# Patient Record
Sex: Female | Born: 1960 | Race: White | Hispanic: No | Smoking: Never smoker
Health system: Southern US, Community
[De-identification: ages and names within clinical notes are randomized; demographics above are authoritative.]

## PROBLEM LIST (undated history)

## (undated) DIAGNOSIS — E785 Hyperlipidemia, unspecified: Secondary | ICD-10-CM

## (undated) DIAGNOSIS — R079 Chest pain, unspecified: Secondary | ICD-10-CM

## (undated) DIAGNOSIS — I10 Essential (primary) hypertension: Secondary | ICD-10-CM

## (undated) HISTORY — DX: Essential (primary) hypertension: I10

## (undated) HISTORY — DX: Chest pain, unspecified: R07.9

## (undated) HISTORY — DX: Hyperlipidemia, unspecified: E78.5

---

## 1999-11-16 ENCOUNTER — Encounter: Payer: Self-pay | Admitting: *Deleted

## 1999-11-17 ENCOUNTER — Ambulatory Visit (HOSPITAL_COMMUNITY): Admission: RE | Admit: 1999-11-17 | Discharge: 1999-11-18 | Payer: Self-pay | Admitting: *Deleted

## 1999-11-17 ENCOUNTER — Encounter: Payer: Self-pay | Admitting: *Deleted

## 1999-12-27 ENCOUNTER — Ambulatory Visit (HOSPITAL_COMMUNITY): Admission: RE | Admit: 1999-12-27 | Discharge: 1999-12-27 | Payer: Self-pay | Admitting: *Deleted

## 1999-12-27 ENCOUNTER — Encounter: Payer: Self-pay | Admitting: *Deleted

## 2001-05-01 ENCOUNTER — Ambulatory Visit (HOSPITAL_COMMUNITY): Admission: RE | Admit: 2001-05-01 | Discharge: 2001-05-01 | Payer: Self-pay | Admitting: *Deleted

## 2001-05-01 ENCOUNTER — Encounter: Payer: Self-pay | Admitting: *Deleted

## 2001-05-14 ENCOUNTER — Encounter: Payer: Self-pay | Admitting: *Deleted

## 2001-05-14 ENCOUNTER — Ambulatory Visit (HOSPITAL_COMMUNITY): Admission: RE | Admit: 2001-05-14 | Discharge: 2001-05-14 | Payer: Self-pay | Admitting: *Deleted

## 2001-05-19 ENCOUNTER — Encounter: Payer: Self-pay | Admitting: *Deleted

## 2001-05-19 ENCOUNTER — Inpatient Hospital Stay (HOSPITAL_COMMUNITY): Admission: RE | Admit: 2001-05-19 | Discharge: 2001-05-20 | Payer: Self-pay | Admitting: *Deleted

## 2008-08-03 ENCOUNTER — Observation Stay (HOSPITAL_COMMUNITY): Admission: EM | Admit: 2008-08-03 | Discharge: 2008-08-04 | Payer: Self-pay | Admitting: Emergency Medicine

## 2008-09-01 DIAGNOSIS — R079 Chest pain, unspecified: Secondary | ICD-10-CM

## 2008-09-01 HISTORY — DX: Chest pain, unspecified: R07.9

## 2008-09-01 HISTORY — PX: US ECHOCARDIOGRAPHY: HXRAD669

## 2008-09-01 HISTORY — PX: CARDIOVASCULAR STRESS TEST: SHX262

## 2008-09-09 DIAGNOSIS — E785 Hyperlipidemia, unspecified: Secondary | ICD-10-CM

## 2008-09-09 DIAGNOSIS — I10 Essential (primary) hypertension: Secondary | ICD-10-CM

## 2008-09-09 HISTORY — DX: Hyperlipidemia, unspecified: E78.5

## 2008-09-09 HISTORY — DX: Essential (primary) hypertension: I10

## 2010-06-27 LAB — POCT CARDIAC MARKERS
CKMB, poc: <1 ng/mL — ABNORMAL LOW (ref 1.0–8.0)
Myoglobin, poc: 42.4 ng/mL (ref 12–200)
Troponin i, poc: <0.05 ng/mL (ref 0.00–0.09)

## 2010-06-27 LAB — CBC
HCT: 35 % — ABNORMAL LOW (ref 36.0–46.0)
HCT: 38.8 % (ref 36.0–46.0)
Hemoglobin: 11.6 g/dL — ABNORMAL LOW (ref 12.0–15.0)
Hemoglobin: 13.6 g/dL (ref 12.0–15.0)
MCHC: 34.3 g/dL (ref 30.0–36.0)
MCHC: 34.4 g/dL (ref 30.0–36.0)
MCHC: 35 g/dL (ref 30.0–36.0)
MCV: 92.1 fL (ref 78.0–100.0)
MCV: 92.7 fL (ref 78.0–100.0)
MCV: 94.1 fL (ref 78.0–100.0)
Platelets: 334 10*3/uL (ref 150–400)
Platelets: 375 10*3/uL (ref 150–400)
RBC: 3.59 MIL/uL — ABNORMAL LOW (ref 3.87–5.11)
RBC: 4.22 MIL/uL (ref 3.87–5.11)
RDW: 14 % (ref 11.5–15.5)
RDW: 14 % (ref 11.5–15.5)
WBC: 10.7 10*3/uL — ABNORMAL HIGH (ref 4.0–10.5)

## 2010-06-27 LAB — URINALYSIS, ROUTINE W REFLEX MICROSCOPIC
Bilirubin Urine: NEGATIVE
Specific Gravity, Urine: 1.01 (ref 1.005–1.030)
pH: 8 (ref 5.0–8.0)

## 2010-06-27 LAB — DIFFERENTIAL
Basophils Absolute: 0.1 10*3/uL (ref 0.0–0.1)
Eosinophils Absolute: 0.1 10*3/uL (ref 0.0–0.7)
Eosinophils Relative: 1 % (ref 0–5)
Lymphocytes Relative: 29 % (ref 12–46)

## 2010-06-27 LAB — COMPREHENSIVE METABOLIC PANEL
AST: 25 U/L (ref 0–37)
CO2: 27 mEq/L (ref 19–32)
Chloride: 108 mEq/L (ref 96–112)
Creatinine, Ser: 0.71 mg/dL (ref 0.4–1.2)
GFR calc Af Amer: >60 mL/min (ref >60)
GFR calc non Af Amer: >60 mL/min (ref >60)
Glucose, Bld: 98 mg/dL (ref 70–99)
Total Bilirubin: 0.7 mg/dL (ref 0.3–1.2)

## 2010-06-27 LAB — URINE MICROSCOPIC-ADD ON

## 2010-06-27 LAB — TROPONIN I: Troponin I: 0.02 ng/mL (ref 0.00–0.06)

## 2010-06-27 LAB — POCT I-STAT, CHEM 8
BUN: 11 mg/dL (ref 6–23)
Calcium, Ion: 1.2 mmol/L (ref 1.12–1.32)
Chloride: 104 mEq/L (ref 96–112)
Creatinine, Ser: 0.7 mg/dL (ref 0.4–1.2)
Glucose, Bld: 73 mg/dL (ref 70–99)
HCT: 42 % (ref 36.0–46.0)
Hemoglobin: 14.3 g/dL (ref 12.0–15.0)
Potassium: 3.3 mEq/L — ABNORMAL LOW (ref 3.5–5.1)
Sodium: 140 mEq/L (ref 135–145)
TCO2: 27 mmol/L (ref 0–100)

## 2010-06-27 LAB — PROTIME-INR: Prothrombin Time: 12.8 seconds (ref 11.6–15.2)

## 2010-06-27 LAB — HEPARIN LEVEL (UNFRACTIONATED): Heparin Unfractionated: 0.9 IU/mL — ABNORMAL HIGH (ref 0.30–0.70)

## 2010-06-27 LAB — GLUCOSE, CAPILLARY: Glucose-Capillary: 133 mg/dL — ABNORMAL HIGH (ref 70–99)

## 2010-06-27 LAB — LIPID PANEL: Cholesterol: 166 mg/dL (ref 0–200)

## 2010-06-27 LAB — D-DIMER, QUANTITATIVE: D-Dimer, Quant: 0.26 ug/mL-FEU (ref 0.00–0.48)

## 2010-08-01 NOTE — H&P (Signed)
NAME:  Monica Wells, Monica Wells                 ACCOUNT NO.:  [id number]   MEDICAL RECORD NO.:  [id number]          PATIENT TYPE:  INP   LOCATION:  4731                         FACILITY:  [facility]   PHYSICIAN:  Richard A. Alanda Amass, M.D.DATE OF BIRTH:  1960/12/01   DATE OF ADMISSION:  08/03/2008  DATE OF DISCHARGE:                              HISTORY & PHYSICAL   CHIEF COMPLAINT:  Chest pain.   A 50 year old white female with no prior history of coronary disease,  came to the emergency room secondary to chest pain by EMS.  This  morning, she was awake at 6 a.m., preparing to go to work, developed a  headache over her left eye associated with general malaise, nausea.  She  took Midol and then developed some slight chest pain that increased,  initially felt like an knife-like pressure, but when Dr. Alanda Amass  specifically asked her, was more severe heaviness and gnashing in  midsternal area, initially rated 8/10.  She had shortness of breath and  tingling in her left arm, nausea, and slightly diaphoretic with it.  EMS  picked her up and gave her 1 nitro, but her pressure bottomed down with  this, but it did help with the chest pain.  In the emergency room, she  was given Morphine and then developed pain more diaphragmatic across her  diaphragm area that was brief.  She does have a history of reflux  disease.  This is somewhat similar, but much worse.  Other history  includes gestational diabetes which resolved, hyperlipidemia.  Surgical  history of C-spine surgery x2 in 2001 and 2005.  She has 2 plates and 2  screws in her neck.  She has had a cholecystectomy.  She has had left  carpal tunnel release and a C-section.   FAMILY HISTORY:  Mother is in her 87s, no coronary disease.  Father is  in his 1, he had MI in his 79.  Siblings without coronary disease.   SOCIAL HISTORY:  Married, with 48 child 64 year old daughter.  Lives in  [place].  [place] for [employer] [place], her husband does as  well, and they came to [place], though her primary care is in [place]  because they were closer.  Additionally, the patient's mother-in-law is  a cardiology patient of Dr. Allyson Sabal.  The patient does walk a lot at work.  Does not use tobacco or alcohol.   ALLERGIES:  CODEINE.   OUTPATIENT MEDICATIONS:  Omeprazole 20 mg daily and naproxen p.r.n.  though she did develop constipation with this, so she recently stopped  taking it.   REVIEW OF SYSTEMS:  HEENT:  She does wear glasses.  No blurred vision or  double vision.  SKIN:  Without rashes.  NEUROLOGIC:  Some tingling in  the left arm today, but no syncope, no tingling of her face with her  headache.  GI:  History of reflux disease.  No melena.  Some  constipation.  GU:  No hematuria or dysuria.  MUSCULOSKELETAL:  Neck  surgery as stated and occasional neck pain.  ENDOCRINE:  No diabetes or  thyroid  disease.  CARDIOVASCULAR:  As above.  PULMONARY:  Negative.   PHYSICAL EXAMINATION:  VITAL SIGNS:  Blood pressure 121/86 initially,  currently 110/52, respirations 18, pulse 70, temperature 97.6.  GENERAL:  Alert, oriented white female in no acute distress.  Husband is  with her during the exam.  HEENT: Normocephalic.  Glasses in place.  Sclerae clear.  NECK:  Supple.  No JVD.  No bruits.  LUNGS:  Clear without rales, rhonchi, or wheezes.  HEART:  Heart sounds S1, S2, regular rate and rhythm without murmur,  gallop, rub, or click.  ABDOMEN:  Soft, nontender, positive bowel sounds.  LOWER EXTREMITIES:  Without edema.  Pedal pulses are present.  SKIN:  Warm and dry.  Brisk capillary refill.  NEUROLOGIC:  Alert and oriented x3.  Moves all extremities.  Follows  commands.   LABORATORY DATA:  Hemoglobin 13.6, hematocrit 38.8, WBC 10.7, platelets  375.  Sodium 140, potassium 3.3, BUN 11, creatinine 0.7, glucose 73,  myoglobin 42, CK-MB less than 1.0, troponin I less than 0.05, INR 1, PTT  29.   IMPRESSION:  1. Chest pain, rule out  myocardial infarction, with family history of      coronary disease and history of dyslipidemia.  2. History of gastroesophageal reflux disease.  3. History of neck surgery.   PLAN:  Admit to telemetry unless pain relieved, and then we will  increase her acuity to a step-down bed.  Serial CK-MBs, IV heparin.  Due  to the hypotension, unable to give nitroglycerin now, will hold, and  also avoid starting ACE inhibitors.  We will put her on low-dose beta-  blocker if her pressure will tolerate it.  Parameters were given.  Additionally, Dr. Alanda Amass feels if her enzymes are negative and EKG  remains unchanged, she would be a candidate for outpatient stress test  versus cardiac CT in the morning.  We will do a D-dimer, if this is  elevated, we will go ahead and do a CT angio tonight to rule out PE.  The patient and her husband had no further questions.       Darcella Gasman. [place], N.P.      Richard A. Alanda Amass, M.D.  Electronically Signed    LRI/MEDQ  D:  08/03/2008  T:  08/04/2008  Job:  [id number]

## 2010-08-01 NOTE — Discharge Summary (Signed)
Monica Wells, Monica Wells                 ACCOUNT NO.:  [id number]   MEDICAL RECORD NO.:  [id number]          PATIENT TYPE:  INP   LOCATION:  4731                         FACILITY:  [facility]   PHYSICIAN:  Ritta Slot, MD     DATE OF BIRTH:  06-Jun-1960   DATE OF ADMISSION:  08/03/2008  DATE OF DISCHARGE:  08/04/2008                               DISCHARGE SUMMARY   DISCHARGE DIAGNOSES:  1. Chest pain, atypical.  2. History of gastroesophageal reflux disease.  3. History of gestational diabetes.  4. Mild elevated lipids.   LABORATORY DATA:  Hemoglobin 11.6, hematocrit 33.8.  Lipids, total  cholesterol 166, HDL 41, LDL 110.  CK-MBs and troponins were negative.   HOSPITAL COURSE:  [name] came in with complaints of chest pain.  She  said they were sharp in her epigastrium, rated 8/10 and she had some  nausea, and she had tingling in her left arm.  She was brought in the  hospital for further evaluation.  CK-MBs and troponins were negative.  She was placed on heparin.  This was discontinued on Aug 04, 2008.  She  was considered stable for discharge home to have outpatient followup.  She will undergo stress test and a 2-D echocardiogram and then follow up  with Dr. Ritta Slot.   DISCHARGE MEDICATIONS:  1. Metoprolol succinate 12.5 mg once per day.  2. Aspirin 81 mg 2 per day.  3. Simvastatin 20 mg at bedtime.  4. She should increase her omeprazole to 20 mg twice per day.  Her blood pressures were running low at 97/60, thus her metoprolol was  cut down at the time of discharge.      Lezlie Octave, N.P.      Ritta Slot, MD  Electronically Signed    BB/MEDQ  D:  08/04/2008  T:  08/05/2008  Job:  [id number]   cc:   Lanora Manis

## 2010-08-04 NOTE — Discharge Summary (Signed)
[place]. [facility]  Patient:    Monica Wells, Monica Wells Visit Number: [id number] MRN: [id number]          Service Type: SUR Location: [id number] Attending Physician:  Evonnie Dawes Dictated by:   Donnal Debar Gasper Sells, M.D. Admit Date:  05/19/2001 Discharge Date: 05/20/2001   CC:         Elsie Stain, M.D.   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  DISCHARGE DIAGNOSES: 1. Status post right C4-5 cervical laminotomy and C5 foraminotomy. 2. Harvesting of autograft from C4-5 laminectomy site as well as the spinous    process of C6. 3. C4-5 facet fusion and fixation from C5-6 to C4 using Synthes ______ fix    instrumentation titanium rod with distraction at that level.  SURGEON:  Ricky D. Gasper Sells, M.D.  ASSISTANT:  Annie Sable, Montez Hageman., M.D.  ANESTHESIA:  General, controlled.  Sponge, needle, and instrument counts were correct.  HISTORY OF PRESENT ILLNESS:  This is a 50 year old female whom I previously did a C5-6 anterior disk infusion with a titanium plate approximately 19 months ago.  This was uneventful.  Within the last month she turned while at work and reached for something, she felt something pop in her neck and developed severe right shoulder pain, specifically the superior mesial spine of the scapula with numbness and tingling going down the right arm, and some weakness of supraspinatus and deltoid muscles.  ______ could be guarding.  She also had supraclavicular tenderness and a clearly positive right Adsons for decreased pulse and numbness and tingling of the hand.  I did a video flexion extension view with Dr. Benard Rink in the week prior to surgery, and I felt that the C4-5 level was unstable on the right side, and perhaps even a little bit on the left.  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  The syndrome fit  perfectly. Nevertheless, I did a selective right C4 facet joint block preoperatively, and under fluoroscopic control, and using this technique I was able to block her pain with 1 cc of 0.5% bupivacaine injected dorsolaterally in the C4-5 facet joint.  She was sat up in the operating room, and her pain was completely gone.  For this reason, further blocks were not carried out specifically at the right C6-7 level.  For this reason, she was then taken to the operating room where an uncomplicated approach was made with x-ray control at the C4, C5, and C6 level.  The C4-5 level on the right side was grossly diastatic and clearly hypermobile and unstable.  The joint opened greater than 5 mm, with the upper limit of normal being 1.3 mm.  On the left side, there was some laxness of the joint capsule and was still present.  This probably could have healed on the right side.  There is no question that this was completely gone.  Preoperatively, I could relieve some of her discomfort by distracting her head on her cervical spine using axial traction, so I distracted the level once the fixation system in place and locked into place, and displaced approximately 1.5 mm.  I also did a foraminotomy/laminotomy of C4-5 on the right side, and fused the C4-5 facet bilaterally with DBX bone paste, as well as the paste its own bone which was harvested from the laminectomy site, as well as the spinous process at C6, macerated and placed  back into the patient.  The wound was then irrigated with vancomycin solution once the dorsal nuchal fascia was closed, as well as subcutaneously.  In the accumulated [employer] experience, there is a 6% infection rate with posterior spinal fixation which is remarkably high, this is whether it is in the lumbar, thoracic, or cervical spine.  It is actually higher the higher you go up in the cervical spine for some reason.  The infusion and intraoperative vancomycin  intravenously was done to hopefully decrease this risk.  The patient awoke from surgery without her usual left shoulder pain.  I would point out that I also cut the nerve which goes from the C4-5 facet joint, which is the first dorsal ramus of the fifth cervical root, or the so called fifth cervical nerve.  This also makes an afferent contribution to the S-cervicalis reflex leading to the abnormal Adsons maneuver.  The idea of cutting the first dorsal ramus came to me with the observation that these patients, even though they have clearly positive C4-5 facet instability syndrome, often have significant residual pain in their shoulder.  In her case, she awoke from surgery without her usual shoulder pain.  The weakness she manifested in her supraspinatus and deltoids was completely gone.  I delayed discharge of her for a couple of hours because she was given a shot of Demerol, and it made her too drowsy to discharge her.  She was discharged in the afternoon following surgery.  Her condition at discharge was markedly improved.  DISPOSITION:  Home.  Her vital signs are stable.  She is voiding and eating normally.  FOLLOWUP:  She was told to come and see me in my office in one week.  WOUND CARE:  She was given dressings, and told to change her dressing every second day.  CONDITION ON DISCHARGE:  After her shower, her condition at discharge improved.  DIAGNOSIS:  Status post C4-5 fusion and fixation C4 to C6 using Surgifix Synthes instrumentation. Dictated by:   Donnal Debar Gasper Sells, M.D. Attending Physician:  Evonnie Dawes DD:  05/20/01 TD:  05/21/01 Job: [id number]

## 2010-08-04 NOTE — Discharge Summary (Signed)
[place]. [facility]  Patient:    Monica Wells, Monica Wells                      MRN: [id number] Adm. Date:  [id number] Disc. Date: [id number] Attending:  Evonnie Dawes CC:         Berenice Bouton, M.D., [facility]  Gershon Crane, M.D., [facility]   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  Specifically, primarily vertigo with sustained nystagmus when looking to the left, when extending or rotating the head to that side, or any sort of painful stimulus like for example, palpation of the joint with a lesser degree of clinical significance to the weakness of the biceps on the left and numbness and tingling on the thumb and index finger on the left.  No signs of myelopathy.  SUMMARY:  I saw this interesting 50 year old female with an atypical presentation of cervical disk disease on November 15, 1999.  She actually is a sister of a patient of mine.  She tells me that she woke up around 2 oclock in the morning approximately three months ago with neck pain, left arm pain, and a secondary syncopal episode.  She had been investigated exhaustively for this.  She had an MRI dated November 03, 1999, which showed a disk on the left at C5-6.  When I examined her, she was minimally weak in the biceps.  She did have some numbness and tingling in the thumb and index finger on the left. There were no signs of myelopathy.  Her left biceps jerk was decreased.  Significantly, she had signs of a hypovestibular system on the left side, possibly due to descending short loop inhibitory phenomena.  Alternatively she probably had vestibular input problems on that side.  Nevertheless, she had positive rombergism to the left.  She had some difficulty identifying objects blinded in the left side of her space, and she had sustained nystagmus on painful stimulation to the neck when staring to the left.  I  think all of these are epiphenomena.  I took her to the operating room and removed the C5-6 disk.  There was a definite rent in the annulus at C5-6 eccentric to the left, and there was disk material on the other side of the annulus at that level.  The annulus and posterior longitudinal ligament were removed.  The dura was exposed.  A foraminotomy at C6 was performed on that side.  There were no complications. The dura was intact.  A 6 mm posterior 8 mm anterior truncated wedge was inserted in the disk space, and the whole thing was held in place with a 26 mm titanium plate and four 14 mm self-tapping screws, EBI system.  The patient awoke from surgery without her usual pain.  She ambulated nicely on the evening following surgery and on the morning following surgery.  Her vital signs remained stable.  The symptoms that she came in with; namely, neck pain, left arm pain, and dizziness completely resolved.  Her strength returned to normal in her biceps.  She was discharged home with OxyContin 10 mg p.o. b.i.d. for pain.  I will see her in my office in four days time in followup.  CONDITION ON DISCHARGE:  Markedly better.  DISPOSITION:  Home.  DISCHARGE DIAGNOSIS:  Status post anterior C5-6 fusion with titanium fixation. D:  11/18/99 TD:  11/20/99 Job: [id number]

## 2010-08-04 NOTE — H&P (Signed)
[place]. [facility]  Patient:    Monica Wells, Monica Wells Visit Number: [id number] MRN: [id number]          Service Type: SUR Location: 5700 [id number] [id number] Attending Physician:  Evonnie Dawes Dictated by:   Dora Sims, M.D.D. Admit Date:  05/19/2001   CC:         Annie Paras, M.D.   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  OPERATION: 1. Right C4-5 cervical laminotomy. 2. Harvesting of autograft bone from the C4-5 laminotomy site as well as    the spinous process of C6. 3. C4-5 facet fusion and fixation from C5-6 to C4 using Synthes Cervifix    instrumentation and a titanium rod with distraction at that level.  SURGEON:  Ricky D. Gasper Sells, M.D.  ASSISTANT:  Annie Sable, Montez Hageman., M.D.  ANESTHESIA:  General, controlled.  COMPLICATIONS:  Nil.  ESTIMATED BLOOD LOSS:  Less than 100 cc.  SUMMARY:  This is 50 year old female on whom I performed a C5-6 fusion in uncomplicated fashion 19 months prior to this procedure.  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  I could not make my mind up whether her weakness of her deltoid and supraspinatus muscle was guarding or was in fact due to a ruptured disk above the level of the fusion. Myelogram CT scan ruled out a ruptured disk at C4-5 and C6-7.  The fusion looked intact.  The MRI similarly showed the same things; however, there were some abnormalities at C6-7, particularly on the myelogram.  It was decided to observe her, and this was carried out for about a week, and she did not get any better.  She presented with, not only the weakness in the supraspinatus and deltoids on the right, but also numbness and tingling involving the right hand, particularly when turning her head to  the left.  She had supraclavicular tenderness and a positive Adsons maneuver on the right side.  She also had tenderness over the upper cervical zygapophyseal joints on digital palpation and an area of point tenderness in the superior mesial spine of the scapula which is well described by Professor Aggie Hacker in [place] as a trigger point for the C4-5 facet joint.  Earlier on the day of this surgery, it was clearly demonstrated by an upper cervical block of the right C4-5 facet joint that this was the area of her major discomfort, and the discomfort could be relieved by blocking the joint with local anesthetic.  Based on this and the clear evidence of instability of this joint on video flexion-extension view the week prior to surgery, it was decided to fuse that level to the stable C5-6 level.  This was carried out today with distraction without difficulty and including in the operation a right C4-5 cervical laminotomy, foraminotomy of the C5 nerve root.  No complications occurred.  The patient awoke from surgery without her usual findings.  The intraoperative findings, however, were that the C4-5 joint was clearly diastatic at least 5 mm, upper limits of norma being 1.3 mm.  There was no joint capsule on the right side.  On the left side, the level was a little bit lax, but certainly there was intact joint capsule and no evidence of instability on that side.  Therefore,  she had unilateral instability of the C4-5 level at the facet joint level.   Usually this was associated with some abnormalities in the disk as well; however, these were not demonstrated by myelogram, CT scan, or MRI.  Intraoperative control x-ray confirmed the C4-5 level had been fused to the C5-6 level.  DESCRIPTION OF PROCEDURE:  With the patient in the prone position and the patients head held in the Mayfield head clamp and carefully positioned in the neutral position, the patient was prepped and draped in the usual  fashion for a midline posterior approach.  Bearhugger blanket was in place as were PAS hose, and all pressure points had been carefully inspected and padded.  A midline incision was made, staying in the relatively avascular midline and controlling hemostasis with unipolar coagulation, the spinous process of C5 was identified and confirmed by intraoperative control x-ray.  Paraspinal muscles and ligaments were then dissected free of the C4, C5, and C6 levels bilaterally.  A McCullough retractor was then inserted.  Bringing in the microscope, dissection was carried out more laterally at the clearly diastatic and unstable right C4-5 facet level down to the level of the first dorsal ramus of the C5 nerve root or so called fifth cervical nerve.  This nerve supplies the facet joint.  It was identified and divided with unipolar coagulation.  It also supplies a small area of skin sensation in the midline above the C5 spinous process.  In addition, there is some connection to the facet in a reflex fashion as the cervicalis to the scalenus musculature, accounting for the positive Adsons maneuver.  This is incidentally noted.  Using a high-speed drill, an laminotomy/foraminotomy of C4-5 was then carried out, the foramina being the C5 nerve root.  The dorsal 3 mm and lateral 3 mm of the facet joint were then ground down to cancellous bone, and this cancellous bone was roughened up with a bone curet superiorly and inferiorly.  On the left, the dorsal 3 mm and lateral 3 mm of the  C4-5 joint was then drilled out using the high-speed drill, and the bone was roughened up down to bleeding cancellous bone.  It was clear that the C4-5 level, right much more so than left, was unstable. The mid point of the lateral mass of C4, C5, and C6 was then identified and marked.  Then, using the axis 3.5 mm drill set, a hole was drilled from the mid point superiorly and laterally so as to miss the vertebral artery as  well  as the nerve roots, basically following the plane of the facet joints.  These holes were then tapped with a 3.5 mm tap.  Then, using the Synthes Cervifix system with lateral attachment hardware and an appropriate length titanium rod, fixation was carried out first on the right and then on the left, snugging the fixation cleanly using 3.5 x 12 mm screws mass at C5 and C6 and leaving the C4 lateral mass, using a 3.5 x 14 mm screw loose.  Distractor was then placed, first on the right to distract the C4-5 level and then firm up the C4 lateral mass screw and then on the left.  The C4-5 facet was then packed with DBX bone paste mixed with the patients own bone which had been harvested.  This was held in place with a piece of Surgicel.  The wound was then irrigated with Betadine solution and hydrogen peroxide.  The self-retaining retractor was then removed, and the dorsonuchal fascia was  closed with 2-0 Vicryl in interrupted fashion.  When the fascia was closed, 5 cc of vancomycin solution was placed subfascially, and then the skin was closed with 2-0 Vicryl inverted interrupted.  Skin staples were used in the skin until one staple was left when a further 5 cc of vancomycin solution was injected subcutaneously, and then the last staple was placed.  Betadine ointment and dry dressing were applied.  The patient was then turned into her own bed while still intubated, and the Mayfield head clamp was removed.  The inferior clamp hole on the left side was bleeding, so it was stapled shut with 2 staples.  A Newton brace was then applied to the patients neck.  The patient tolerated the procedure well and awoke from surgery unremarkably, moving all four extremities without her usual right shoulder pain and weakness. Dictated by:   Dora Sims, M.D.D. Attending Physician:  Evonnie Dawes DD:  05/19/01 TD:  05/19/01 Job: [id number] January. [phone]

## 2010-08-04 NOTE — Discharge Summary (Signed)
. [facility]  Patient:    Monica Wells, Monica Wells Visit Number: [id number] MRN: [id number]          Service Type: SUR Location: [id number] Attending Physician:  Evonnie Dawes Dictated by:   Donnal Debar Gasper Sells, M.D. Admit Date:  05/19/2001   CC:         Annie Paras, M.D.   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  OPERATION:  Upper cervical block, specifically right C4-5 facet joint block.  SURGEON:  Ricky D. Gasper Sells, M.D.  ANESTHESIA:  Local.  PREMEDICATION:  0.2 mg Robinul.  COMPLICATIONS:  Nil.  SUMMARY:  This is a 50 year old female on whom I previously did a C5-6 anterior disk infusion.  This was uneventful.  Approximately 19 months following this surgery, she turned while at work and felt something pop in her neck and developed severe right shoulder pain, specifically the superior mesial spine of the scapula with numbness and tingling going down the right arm and some weakness of the supraspinatus and deltoid muscles which could be guarding.  She also has supraclavicular tenderness and a clearly positive right axis maneuver for both decreased pulse and numbness and tingling in the hand.  There was concern that her C4-5 level on the right might be unstable. A video flexion-extension view was done on this lady the week prior to surgery.  While it was said that she had compensatory hypermobility at the C4-5 level, what she actually had was diastasis of the right C4-5 facet clearly visible on the video, of greater than 3, perhaps 4 mm, which closed down with neck spasm.  This is a strongly positive test since, if you can see it on the video, and this sort of thing is usually missed on conventional lateral flexion-extension views, it must be quite unstable.  Nevertheless, just to prove that this was the source of her pain and  discomfort and, given the fact that the natural history of facet diastasis is that of progression in the cervical spine, it was decided to block this level.  A 25-gauge needle was inserted down to the dorsolateral aspect of the C4-5 right-sided facet in the AP plane.  Then 0.5 cc of 0.5 bupivacaine was injected.  Prior to injection, poking the joint recreated her usual discomfort.  The needle was then relocalized more laterally and interiorly, and a further 0.5 cc of 0.5 bupivacaine was injected.  This completely relieved her discomfort.  She as able to move her neck normally following this injection of only 1 cc of local anesthetic, and, by the way, there had been aspiration before each injection.  Based on this, she is a candidate for a C4-5 fusion.  DESCRIPTION OF PROCEDURE:  With the patient in the prone position, with the patients neck prepped with Betadine solution, and using AP fluoroscopic control, a 25-gauge needle was inserted down to the dorsilateral aspect of the C4-5 facet joint.  Poking the joint in that location recreated her usual discomfort.  Using 0.5 bupivacaine without epinephrine or preservative, 0.5 cc of local anesthetic was injected with aspiration beforehand.  Then the needle was localized more laterally and anteriorly, and a further 0.5 cc was injected with aspiration before the inject.  This completely knocked out her neck discomfort.  She was then sat up in the operating room, and she extended and rotated her head to the right side, and it did  not cause discomfort.  She had no further neck pain, and her Adsons sign returned to completely normal. There was no weakness in the deltoid muscle or the supraspinatus, suggesting that weakness was in fact guarding.  Based on this study, it was decided she was a good candidate for a C4-5 fusion, fusing that level, which was clearly unstable, to the more stable C5-6 level which had already been fused. Dictated by:    Donnal Debar Gasper Sells, M.D. Attending Physician:  Evonnie Dawes DD:  05/19/01 TD:  05/19/01 Job: [id number]

## 2010-11-05 IMAGING — CR DG CHEST 1V PORT
1 series · 1 of 1 positions shown · non-contrast
Comparison: None

CLINICAL DATA: Chest pain

PORTABLE CHEST - 1 VIEW

[view not recorded]
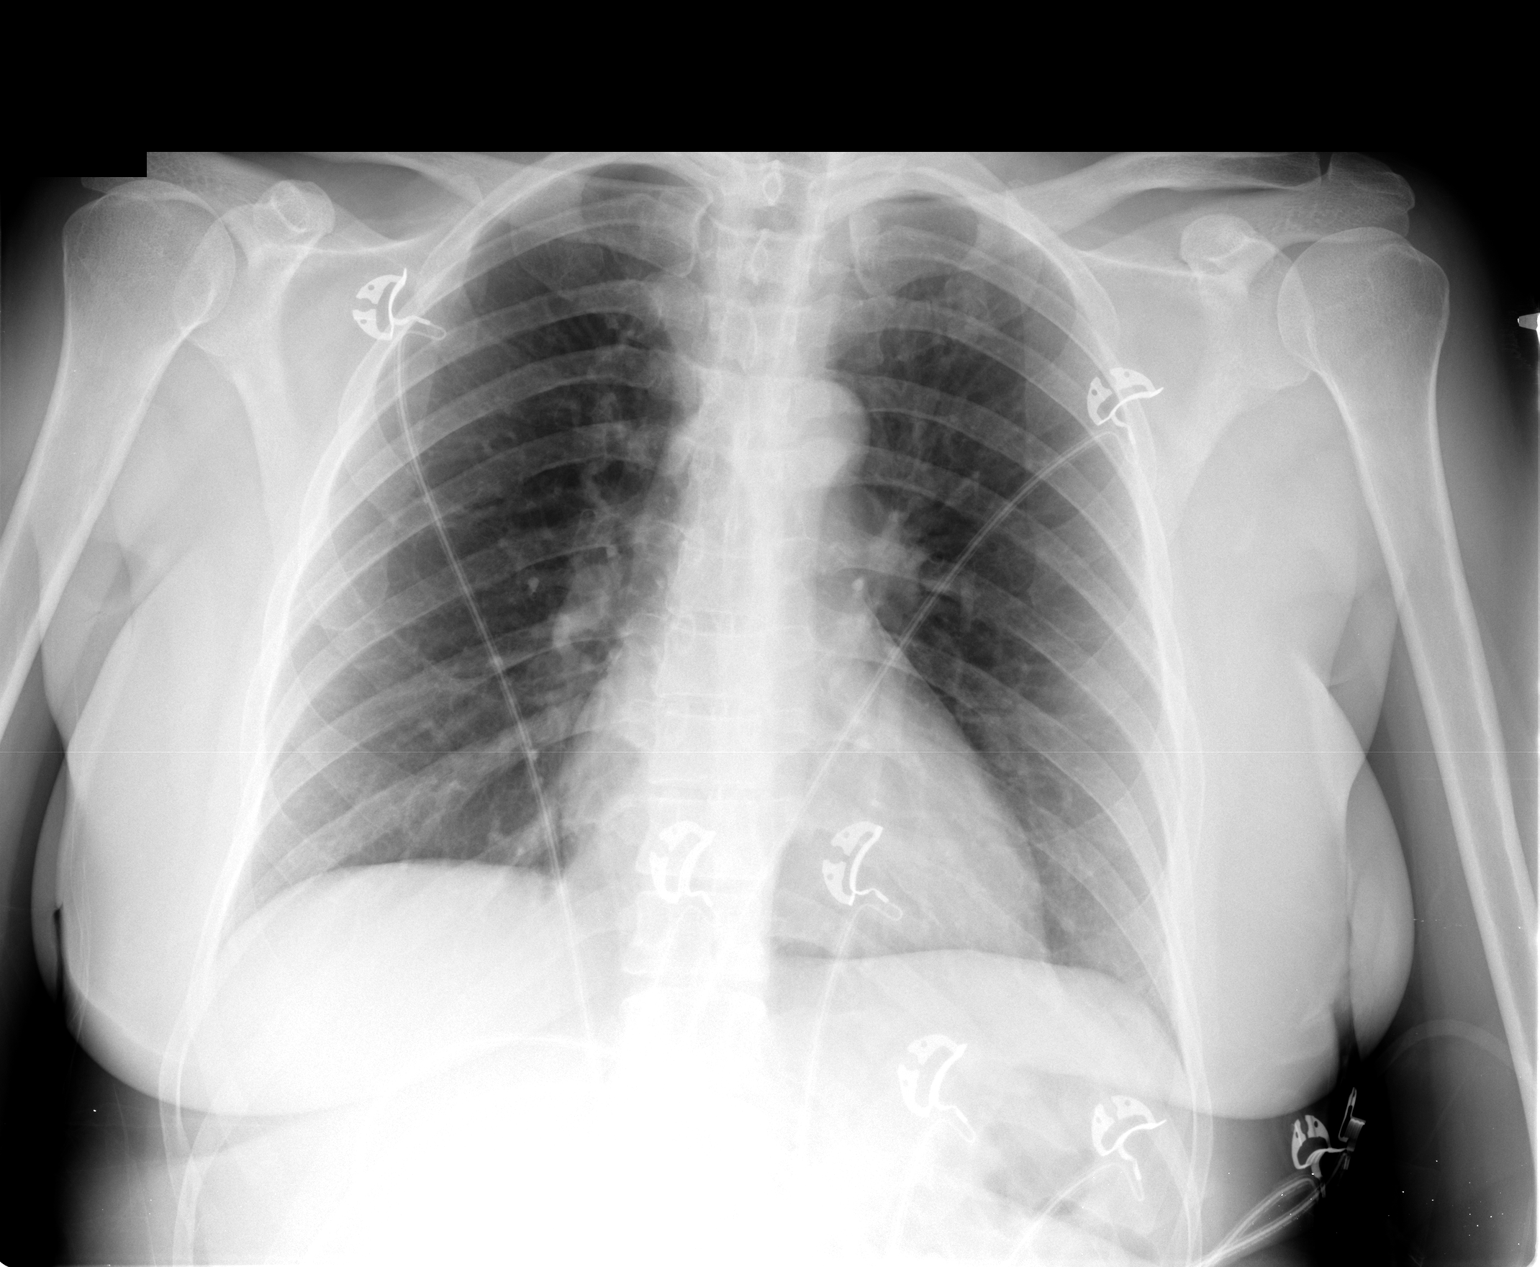

[1 of 1 positions shown; findings below may reference images not displayed]

FINDINGS: The lungs are clear.  The heart size is normal.  No
pleural fluid.  No infiltrates.
IMPRESSION: No active disease.

## 2012-08-12 ENCOUNTER — Encounter: Payer: Self-pay | Admitting: Internal Medicine

## 2012-08-12 ENCOUNTER — Encounter: Payer: Self-pay | Admitting: *Deleted

## 2012-08-13 ENCOUNTER — Encounter: Payer: Self-pay | Admitting: Internal Medicine

## 2012-08-13 ENCOUNTER — Ambulatory Visit (INDEPENDENT_AMBULATORY_CARE_PROVIDER_SITE_OTHER): Payer: BC Managed Care – PPO | Admitting: Internal Medicine

## 2012-08-13 VITALS — BP 130/86 | HR 61 | Ht 63.0 in | Wt 140.4 lb

## 2012-08-13 DIAGNOSIS — I10 Essential (primary) hypertension: Secondary | ICD-10-CM

## 2012-08-13 DIAGNOSIS — E782 Mixed hyperlipidemia: Secondary | ICD-10-CM

## 2012-08-13 DIAGNOSIS — Z79899 Other long term (current) drug therapy: Secondary | ICD-10-CM

## 2012-08-13 DIAGNOSIS — E785 Hyperlipidemia, unspecified: Secondary | ICD-10-CM | POA: Insufficient documentation

## 2012-08-13 MED ORDER — METOPROLOL SUCCINATE ER 25 MG PO TB24: 12.5000 mg | ORAL_TABLET | Freq: Every day | ORAL | Status: DC

## 2012-08-13 MED ORDER — ATORVASTATIN CALCIUM 80 MG PO TABS: 80.0000 mg | ORAL_TABLET | Freq: Every day | ORAL | Status: DC

## 2012-08-13 NOTE — Progress Notes (Signed)
OFFICE NOTE  Chief Complaint:  Routine followup  Primary Care Physician: Berenice Bouton, MD  HPI:  Monica Wells is a 51 year old female who works for [employer] in [place]. She has a history of dyslipidemia and hypertension. In the past she had chest pain which was felt to be atypical, was evaluated for that and treated medically. She had a nuclear stress test in 2010 which was negative. Since that time she has done well with medical therapy and is currently asymptomatic. She is here essentially for refills on her medicines today. She denies any new complaints. At her last office visit we obtained a cholesterol particle study. This demonstrated a total MMR LDL particle #1602, calculated LDL-C. of 129, HDL C. of 56, and triglycerides 135.  I recommended changing simvastatin to atorvastatin 80 mg daily.  She is tolerating atorvastatin without issues, and has managed to lose 4-6 pounds.  Overall I expect her lipid profile looked much better.  PMHx:  Past Medical History  Diagnosis Date  . Chest pain 09/01/2008    Atypical chect pains---NUC performed, which showed a post stress EF of 93%, Normal LV size, EKG negative for ischemia. An ECHO was also performed on this date, which showed an EF=>55% and only mild tricuspid and mitral regurgitation---normal for age  . Dyslipidemia 09/09/2008    Patient had been taking Simvastatin, but was recently switched to Atorvastatin 80 after an NMR LipoProfile with lipids of 1602, LDL-C of 129, and LDL size of 20.4 (taken on 01/20/2012).  . Hypertension 09/09/2008    Past Surgical History  Procedure Laterality Date  . US echocardiography  09/01/2008    EF=>55% and only mild tricuspid and mitral regurgitation---normal for age  . Cardiovascular stress test  09/01/2008    Post stress EF of 93%, Normal LV size, EKG negative for ischemia.    FAMHx:  Family History  Problem Relation Age of Onset  . Cancer Mother   . Diabetes Mother   . Diabetes Father   .  Heart disease Father   . Heart attack Father   . Cancer Maternal Grandmother   . Heart failure Maternal Grandfather   . Diabetes Paternal Grandfather   . Heart disease Paternal Grandfather     SOCHx:   reports that she has never smoked. She does not have any smokeless tobacco history on file. She reports that  drinks alcohol. Her drug history is not on file.  ALLERGIES:  Allergies  Allergen Reactions  . Codeine     ROS: A comprehensive review of systems was negative except for: Cardiovascular: positive for fatigue  HOME MEDS: Current Outpatient Prescriptions  Medication Sig Dispense Refill  . aspirin 81 MG tablet Take 81 mg by mouth daily.      Marland Kitchen atorvastatin (LIPITOR) 80 MG tablet Take 1 tablet (80 mg total) by mouth daily.  90 tablet  3  . fluticasone (FLONASE) 50 MCG/ACT nasal spray Place 2 sprays into the nose daily.      . metoprolol succinate (TOPROL-XL) 25 MG 24 hr tablet Take 0.5 tablets (12.5 mg total) by mouth daily. 1/2 tablet daily  45 tablet  3  . omeprazole (PRILOSEC) 20 MG capsule Take 20 mg by mouth daily.       No current facility-administered medications for this visit.    LABS/IMAGING: No results found for this or any previous visit (from the past 48 hour(s)). No results found.  VITALS: BP 130/86  Pulse 61  Ht 5\' 3"  (1.6 m)  Wt 140 lb  6.4 oz (63.685 kg)  BMI 24.88 kg/m2  EXAM: General appearance: alert and no distress Neck: no adenopathy, no carotid bruit, no JVD, supple, symmetrical, trachea midline and thyroid not enlarged, symmetric, no tenderness/mass/nodules Lungs: clear to auscultation bilaterally Heart: regular rate and rhythm, S1, S2 normal, no murmur, click, rub or gallop Abdomen: soft, non-tender; bowel sounds normal; no masses,  no organomegaly Extremities: extremities normal, atraumatic, no cyanosis or edema Pulses: 2+ and symmetric Skin: Skin color, texture, turgor normal. No rashes or lesions Neurologic: Grossly  normal  EKG: Normal sinus rhythm at 61  ASSESSMENT: 1. Hypertension-controlled 2. Dyslipidemia  PLAN: 1.   [name] continues to do well. She is active in her job and does have some problems with soreness in her legs and occasional swelling. She has some reticular veins but no obvious varicose veins. Her blood pressure is well-controlled and she has managed to lose weight which recommended her on. We will plan to recheck her lipid NMR, and adjust her cholesterol medication accordingly. Plan to continue see her back in the office annually.  Chrystie Nose, MD, [facility] Attending Cardiologist [facility]  HILTY,Kenneth C 08/13/2012, 5:45 PM

## 2012-08-13 NOTE — Patient Instructions (Signed)
Your physician recommends that you continue on your current medications as directed. Please refer to the Current Medication list given to you today.  Your physician wants you to follow-up in: 12 months You will receive a reminder letter in the mail two months in advance. If you don't receive a letter, please call our office to schedule the follow-up appointment.  

## 2012-08-14 LAB — COMPREHENSIVE METABOLIC PANEL
ALT: 20 U/L (ref 0–35)
BUN: 9 mg/dL (ref 6–23)
CO2: 29 mEq/L (ref 19–32)
Calcium: 9.8 mg/dL (ref 8.4–10.5)
Chloride: 102 mEq/L (ref 96–112)
Creat: 0.78 mg/dL (ref 0.50–1.10)
Glucose, Bld: 95 mg/dL (ref 70–99)

## 2012-08-15 LAB — NMR LIPOPROFILE WITH LIPIDS
HDL Size: 8.8 nm — ABNORMAL LOW (ref >=9.2)
HDL-C: 41 mg/dL (ref >=40)
LDL (calc): 68 mg/dL (ref <100)
LDL Particle Number: 914 nmol/L (ref <1000)
LP-IR Score: 52 — ABNORMAL HIGH (ref <=45)
Large VLDL-P: 1.9 nmol/L (ref <=2.7)
Triglycerides: 141 mg/dL (ref <150)
VLDL Size: 45.4 nm (ref <=46.6)

## 2012-10-06 ENCOUNTER — Other Ambulatory Visit: Payer: Self-pay | Admitting: Internal Medicine

## 2012-10-07 NOTE — Telephone Encounter (Signed)
flonase refilled 10/06/12

## 2013-08-23 ENCOUNTER — Other Ambulatory Visit: Payer: Self-pay | Admitting: Internal Medicine

## 2013-08-24 NOTE — Telephone Encounter (Signed)
Rx was sent to pharmacy electronically. 

## 2013-08-26 ENCOUNTER — Other Ambulatory Visit: Payer: Self-pay | Admitting: Internal Medicine

## 2013-08-26 NOTE — Telephone Encounter (Signed)
Refilled #45 with 1 refill on 08/24/13

## 2013-09-14 ENCOUNTER — Ambulatory Visit (INDEPENDENT_AMBULATORY_CARE_PROVIDER_SITE_OTHER): Payer: BC Managed Care – PPO | Admitting: Internal Medicine

## 2013-09-14 ENCOUNTER — Encounter: Payer: Self-pay | Admitting: Internal Medicine

## 2013-09-14 VITALS — BP 112/70 | HR 71 | Ht 63.0 in | Wt 145.4 lb

## 2013-09-14 DIAGNOSIS — I10 Essential (primary) hypertension: Secondary | ICD-10-CM

## 2013-09-14 DIAGNOSIS — E785 Hyperlipidemia, unspecified: Secondary | ICD-10-CM

## 2013-09-14 NOTE — Patient Instructions (Signed)
Your physician recommends that you return for lab work in: FASTING at [facility].  We will call you with the results.  Your physician recommends that you schedule a follow-up appointment in: One year with Dr. Rennis GoldenHilty.

## 2013-09-14 NOTE — Progress Notes (Signed)
OFFICE NOTE  Chief Complaint:  Routine followup  Primary Care Physician: Monica Likes, MD  HPI:  Monica Wells is a 53 year old female who works for [employer] in [place]. She has a history of dyslipidemia and hypertension. In the past she had chest pain which was felt to be atypical, was evaluated for that and treated medically. She had a nuclear stress test in 2010 which was negative. Since that time she has done well with medical therapy and is currently asymptomatic. She is here essentially for refills on her medicines today. She denies any new complaints. At her last office visit we obtained a cholesterol particle study. This demonstrated a total MMR LDL particle #1602, calculated LDL-C. of 129, HDL C. of 56, and triglycerides 135.  I recommended changing simvastatin to atorvastatin 80 mg daily.  She initially took that without issues, but then reported some muscle pains and aches. Apparently, last summer she had an episode where she passed out several times in a row. She was taken to the emergency room and she was felt to be dehydrated. Her cholesterol medicine was stopped at that time she has not restarted it.  She has done well since then.  PMHx:  Past Medical History  Diagnosis Date  . Chest pain 09/01/2008    Atypical chect pains---NUC performed, which showed a post stress EF of 93%, Normal LV size, EKG negative for ischemia. An ECHO was also performed on this date, which showed an EF=>55% and only mild tricuspid and mitral regurgitation---normal for age  . Dyslipidemia 09/09/2008    Patient had been taking Simvastatin, but was recently switched to Atorvastatin 80 after an NMR LipoProfile with lipids of 1602, LDL-C of 129, and LDL size of 20.4 (taken on 01/20/2012).  . Hypertension 09/09/2008    Past Surgical History  Procedure Laterality Date  . US echocardiography  09/01/2008    EF=>55% and only mild tricuspid and mitral regurgitation---normal for age  .  Cardiovascular stress test  09/01/2008    Post stress EF of 93%, Normal LV size, EKG negative for ischemia.    FAMHx:  Family History  Problem Relation Age of Onset  . Cancer Mother   . Diabetes Mother   . Diabetes Father   . Heart disease Father   . Heart attack Father   . Cancer Maternal Grandmother   . Heart failure Maternal Grandfather   . Diabetes Paternal Grandfather   . Heart disease Paternal Grandfather     SOCHx:   reports that she has never smoked. She does not have any smokeless tobacco history on file. She reports that she drinks alcohol. Her drug history is not on file.  ALLERGIES:  Allergies  Allergen Reactions  . Codeine     ROS: A comprehensive review of systems was negative.  HOME MEDS: Current Outpatient Prescriptions  Medication Sig Dispense Refill  . aspirin 81 MG tablet Take 81 mg by mouth daily.      . fluticasone (FLONASE) 50 MCG/ACT nasal spray 1-2 SQUIRTS EACH NOSTRIL DAILY  16 g  3  . metoprolol succinate (TOPROL-XL) 25 MG 24 hr tablet TAKE 0.5 TABLETS (12.5 MG TOTAL) BY MOUTH DAILY. 1/2 TABLET DAILY  45 tablet  1  . omeprazole (PRILOSEC) 20 MG capsule Take 20 mg by mouth daily.       No current facility-administered medications for this visit.    LABS/IMAGING: No results found for this or any previous visit (from the past 48 hour(s)). No results found.  VITALS: BP  112/70  Pulse 71  Ht 5' 3"  (1.6 m)  Wt 145 lb 6.4 oz (65.953 kg)  BMI 25.76 kg/m2  EXAM: General appearance: alert and no distress Neck: no adenopathy, no carotid bruit, no JVD, supple, symmetrical, trachea midline and thyroid not enlarged, symmetric, no tenderness/mass/nodules Lungs: clear to auscultation bilaterally Heart: regular rate and rhythm, S1, S2 normal, no murmur, click, rub or gallop Abdomen: soft, non-tender; bowel sounds normal; no masses,  no organomegaly Extremities: extremities normal, atraumatic, no cyanosis or edema Pulses: 2+ and symmetric Skin: Skin  color, texture, turgor normal. No rashes or lesions Neurologic: Grossly normal  EKG: Normal sinus rhythm at 71  ASSESSMENT: 1. Hypertension-controlled 2. Dyslipidemia- not currently on statin  PLAN: 1.   [name] continues to do well. She is active in her job and does have some problems with soreness in her legs and occasional swelling. She had discontinued her statin due to muscle aches and pains. I would like to recheck her lipid profile to see if she needs to be on cholesterol medicine or not. Her blood pressure is well controlled. She continues to be active and asymptomatic. Plan to see her back annually or sooner as necessary.  Pixie Casino, MD, [facility] Attending Cardiologist [facility] 09/14/2013, 12:44 PM

## 2013-09-23 LAB — LIPID PANEL
Cholesterol: 218 mg/dL — ABNORMAL HIGH (ref 0–200)
HDL: 42 mg/dL (ref >39)
LDL Cholesterol: 147 mg/dL — ABNORMAL HIGH (ref 0–99)
TRIGLYCERIDES: 145 mg/dL (ref <150)
Total CHOL/HDL Ratio: 5.2 Ratio
VLDL: 29 mg/dL (ref 0–40)

## 2013-09-24 NOTE — Progress Notes (Signed)
LMTCB regarding lab results.  

## 2013-09-25 ENCOUNTER — Other Ambulatory Visit: Payer: Self-pay | Admitting: *Deleted

## 2013-09-25 ENCOUNTER — Telehealth: Payer: Self-pay | Admitting: *Deleted

## 2013-09-25 DIAGNOSIS — E785 Hyperlipidemia, unspecified: Secondary | ICD-10-CM

## 2013-09-25 MED ORDER — METOPROLOL SUCCINATE ER 25 MG PO TB24: ORAL_TABLET | ORAL | Status: DC

## 2013-09-25 MED ORDER — SIMVASTATIN 20 MG PO TABS: 20.0000 mg | ORAL_TABLET | Freq: Every day | ORAL | Status: DC

## 2013-09-25 NOTE — Telephone Encounter (Signed)
Patient notified that Dr. Rennis GoldenHilty recommends starting simvastatin 20mg  once daily. Will recheck labs in 3 months (fasting). Patient agreeable.   Med and lab ordered.

## 2013-09-25 NOTE — Telephone Encounter (Signed)
Message copied by Lindell SparELKINS, Rogelio Waynick M on Fri Sep 25, 2013 10:25 AM ------      Message from: Chrystie NoseHILTY, KENNETH C      Created: Thu Sep 24, 2013  9:50 AM       Cholesterol is too high - she should be on medication. Will need to discuss with her what she has taken and caused her side-effects and consider something else.            Dr. Rexene EdisonH ------

## 2013-09-25 NOTE — Progress Notes (Signed)
LMTCB on work # [id number]

## 2013-09-25 NOTE — Telephone Encounter (Signed)
Rx was sent to pharmacy electronically. 

## 2014-01-07 ENCOUNTER — Other Ambulatory Visit: Payer: Self-pay | Admitting: Internal Medicine

## 2014-01-09 NOTE — Telephone Encounter (Signed)
Rx was sent to pharmacy electronically. 

## 2014-10-18 ENCOUNTER — Other Ambulatory Visit: Payer: Self-pay | Admitting: Internal Medicine

## 2014-10-18 NOTE — Telephone Encounter (Signed)
REFILL 

## 2015-01-03 ENCOUNTER — Other Ambulatory Visit: Payer: Self-pay | Admitting: Internal Medicine

## 2015-01-05 ENCOUNTER — Ambulatory Visit (INDEPENDENT_AMBULATORY_CARE_PROVIDER_SITE_OTHER): Payer: BLUE CROSS/BLUE SHIELD | Admitting: Internal Medicine

## 2015-01-05 ENCOUNTER — Encounter: Payer: Self-pay | Admitting: Internal Medicine

## 2015-01-05 VITALS — BP 128/74 | HR 76 | Ht 63.0 in | Wt 156.0 lb

## 2015-01-05 DIAGNOSIS — K219 Gastro-esophageal reflux disease without esophagitis: Secondary | ICD-10-CM

## 2015-01-05 DIAGNOSIS — E785 Hyperlipidemia, unspecified: Secondary | ICD-10-CM | POA: Diagnosis not present

## 2015-01-05 DIAGNOSIS — I10 Essential (primary) hypertension: Secondary | ICD-10-CM

## 2015-01-05 MED ORDER — METOPROLOL SUCCINATE ER 25 MG PO TB24: 12.5000 mg | ORAL_TABLET | Freq: Every day | ORAL | Status: DC

## 2015-01-05 NOTE — Progress Notes (Signed)
OFFICE NOTE  Chief Complaint:  Routine followup  Primary Care Physician: G Lon Charma Igo, MD  HPI:  Monica Wells is a 54 year old female who works for [employer] in [place]. She has a history of dyslipidemia and hypertension. In the past she had chest pain which was felt to be atypical, was evaluated for that and treated medically. She had a nuclear stress test in 2010 which was negative. Since that time she has done well with medical therapy and is currently asymptomatic. She is here essentially for refills on her medicines today. She denies any new complaints. At her last office visit we obtained a cholesterol particle study. This demonstrated a total MMR LDL particle #1602, calculated LDL-C. of 129, HDL C. of 56, and triglycerides 135.  I recommended changing simvastatin to atorvastatin 80 mg daily.  She initially took that without issues, but then reported some muscle pains and aches. Apparently, last summer she had an episode where she passed out several times in a row. She was taken to the emergency room and she was felt to be dehydrated. Her cholesterol medicine was stopped at that time she has not restarted it.  She has done well since then.  I saw Monica Wells back today in the office for follow-up. Overall she's done well. We had made some changes to her cholesterol medication however she had significant side effects from that and ultimately discontinued her simvastatin. Cholesterol profile is mildly elevated however her overall risk is thought to be fairly low. Blood pressure appears to be well-controlled again today.  PMHx:  Past Medical History  Diagnosis Date  . Chest pain 09/01/2008    Atypical chect pains---NUC performed, which showed a post stress EF of 93%, Normal LV size, EKG negative for ischemia. An ECHO was also performed on this date, which showed an EF=>55% and only mild tricuspid and mitral regurgitation---normal for age  . Dyslipidemia 09/09/2008   Patient had been taking Simvastatin, but was recently switched to Atorvastatin 80 after an NMR LipoProfile with lipids of 1602, LDL-C of 129, and LDL size of 20.4 (taken on 01/20/2012).  . Hypertension 09/09/2008    Past Surgical History  Procedure Laterality Date  . US echocardiography  09/01/2008    EF=>55% and only mild tricuspid and mitral regurgitation---normal for age  . Cardiovascular stress test  09/01/2008    Post stress EF of 93%, Normal LV size, EKG negative for ischemia.    FAMHx:  Family History  Problem Relation Age of Onset  . Cancer Mother   . Diabetes Mother   . Diabetes Father   . Heart disease Father   . Heart attack Father   . Cancer Maternal Grandmother   . Heart failure Maternal Grandfather   . Diabetes Paternal Grandfather   . Heart disease Paternal Grandfather     SOCHx:   reports that she has never smoked. She does not have any smokeless tobacco history on file. She reports that she drinks alcohol. Her drug history is not on file.  ALLERGIES:  Allergies  Allergen Reactions  . Sulfamethoxazole-Trimethoprim Diarrhea and Palpitations  . Atorvastatin Other (See Comments)    Myalgias/cramps  . Codeine   . Moxifloxacin Hcl In Nacl     Palpitations     ROS: A comprehensive review of systems was negative.  HOME MEDS: Current Outpatient Prescriptions  Medication Sig Dispense Refill  . aspirin 81 MG tablet Take 81 mg by mouth daily.    . fluticasone (FLONASE) 50 MCG/ACT nasal  spray Place 1-2 sprays into both nostrils daily. 16 g 2  . KRILL OIL PO Take 1 capsule by mouth daily.    . metoprolol succinate (TOPROL-XL) 25 MG 24 hr tablet Take 0.5 tablets (12.5 mg total) by mouth daily. 45 tablet 3  . omeprazole (PRILOSEC) 40 MG capsule Take 1 capsule by mouth daily.     No current facility-administered medications for this visit.    LABS/IMAGING: No results found for this or any previous visit (from the past 48 hour(s)). No results  found.  VITALS: BP 128/74 mmHg  Pulse 76  Ht _0  (1.6 m)  Wt 156 lb (70.761 kg)  BMI 27.64 kg/m2  EXAM: General appearance: alert and no distress Neck: no adenopathy, no carotid bruit, no JVD, supple, symmetrical, trachea midline and thyroid not enlarged, symmetric, no tenderness/mass/nodules Lungs: clear to auscultation bilaterally Heart: regular rate and rhythm, S1, S2 normal, no murmur, click, rub or gallop Abdomen: soft, non-tender; bowel sounds normal; no masses,  no organomegaly Extremities: extremities normal, atraumatic, no cyanosis or edema Pulses: 2+ and symmetric Skin: Skin color, texture, turgor normal. No rashes or lesions Neurologic: Grossly normal  EKG: Normal sinus rhythm at 76  ASSESSMENT: 1. Hypertension-controlled 2. Dyslipidemia- not currently on statin  PLAN: 1.   [name] continues to do well. She is active in her job and does have some problems with soreness in her legs and occasional swelling. She was intolerant of simvastatin which were using for primary prevention of coronary disease. I would recommend repeat lipid testing and work on diet and exercise for now. If she has continued elevated LDL cholesterol over 1:30, treatment may be considered with a low-dose natural statin such as a lovastatin  Plan to see her back annually or sooner as necessary.  Pixie Casino, MD, [facility] Attending Cardiologist [place] 01/05/2015, 12:54 PM

## 2015-01-05 NOTE — Patient Instructions (Signed)
Your physician wants you to follow-up in: 1 year with Dr. Hilty. You will receive a reminder letter in the mail two months in advance. If you don't receive a letter, please call our office to schedule the follow-up appointment.   If you need a refill on your cardiac medications before your next appointment, please call your pharmacy.  

## 2015-02-08 ENCOUNTER — Other Ambulatory Visit: Payer: Self-pay | Admitting: Internal Medicine

## 2015-02-17 ENCOUNTER — Telehealth: Payer: Self-pay | Admitting: *Deleted

## 2015-02-17 NOTE — Telephone Encounter (Signed)
Opened in error

## 2015-09-13 ENCOUNTER — Other Ambulatory Visit: Payer: Self-pay | Admitting: Internal Medicine

## 2015-09-13 NOTE — Telephone Encounter (Signed)
Ok to refill 

## 2015-09-14 NOTE — Telephone Encounter (Signed)
Rx(s) sent to pharmacy electronically.  

## 2015-12-26 ENCOUNTER — Other Ambulatory Visit: Payer: Self-pay | Admitting: Internal Medicine

## 2015-12-26 DIAGNOSIS — I10 Essential (primary) hypertension: Secondary | ICD-10-CM

## 2016-03-25 ENCOUNTER — Other Ambulatory Visit: Payer: Self-pay | Admitting: Internal Medicine

## 2016-03-25 DIAGNOSIS — I10 Essential (primary) hypertension: Secondary | ICD-10-CM

## 2016-03-26 NOTE — Telephone Encounter (Signed)
Rx has been sent to the pharmacy electronically. ° °

## 2016-04-29 ENCOUNTER — Other Ambulatory Visit: Payer: Self-pay | Admitting: Internal Medicine

## 2016-04-29 DIAGNOSIS — I10 Essential (primary) hypertension: Secondary | ICD-10-CM

## 2016-05-30 ENCOUNTER — Other Ambulatory Visit: Payer: Self-pay | Admitting: Internal Medicine

## 2016-05-30 DIAGNOSIS — I10 Essential (primary) hypertension: Secondary | ICD-10-CM

## 2016-06-13 ENCOUNTER — Other Ambulatory Visit: Payer: Self-pay | Admitting: Internal Medicine

## 2016-06-13 DIAGNOSIS — I10 Essential (primary) hypertension: Secondary | ICD-10-CM

## 2016-07-11 ENCOUNTER — Other Ambulatory Visit: Payer: Self-pay | Admitting: Internal Medicine

## 2016-07-11 DIAGNOSIS — I10 Essential (primary) hypertension: Secondary | ICD-10-CM

## 2017-01-08 ENCOUNTER — Other Ambulatory Visit: Payer: Self-pay | Admitting: Internal Medicine

## 2024-02-07 ENCOUNTER — Ambulatory Visit: Admitting: Neurosurgery

## 2024-02-07 ENCOUNTER — Inpatient Hospital Stay
Admission: RE | Admit: 2024-02-07 | Discharge: 2024-02-07 | Disposition: A | Discharge status: Home / Self Care | Payer: Self-pay | Source: Ambulatory Visit | Attending: Neurosurgery | Admitting: Neurosurgery

## 2024-02-07 ENCOUNTER — Other Ambulatory Visit: Payer: Self-pay

## 2024-02-07 ENCOUNTER — Encounter: Payer: Self-pay | Admitting: Neurosurgery

## 2024-02-07 VITALS — BP 127/76 | HR 60 | Ht 63.0 in | Wt 178.0 lb

## 2024-02-07 DIAGNOSIS — Z049 Encounter for examination and observation for unspecified reason: Secondary | ICD-10-CM

## 2024-02-07 DIAGNOSIS — I63232 Cerebral infarction due to unspecified occlusion or stenosis of left carotid arteries: Secondary | ICD-10-CM

## 2024-02-07 DIAGNOSIS — I6522 Occlusion and stenosis of left carotid artery: Secondary | ICD-10-CM | POA: Diagnosis not present

## 2024-02-07 MED ORDER — CLOPIDOGREL BISULFATE 75 MG PO TABS: 75.0000 mg | ORAL_TABLET | Freq: Every day | ORAL | 5 refills | Status: AC

## 2024-02-07 NOTE — Addendum Note (Signed)
 Addended by: Blanchie Zeleznik on: 02/07/2024 08:41 AM   Modules accepted: Orders

## 2024-02-07 NOTE — Addendum Note (Signed)
 Addended by: Anyelo Mccue on: 02/07/2024 08:47 AM   Modules accepted: Orders

## 2024-02-07 NOTE — Addendum Note (Signed)
 Addended by: Dhanya Bogle on: 02/07/2024 08:44 AM   Modules accepted: Orders

## 2024-02-07 NOTE — Addendum Note (Signed)
 Addended by: Tylasia Fletchall on: 02/07/2024 10:39 AM   Modules accepted: Orders

## 2024-02-07 NOTE — Progress Notes (Signed)
 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  Previous imaging demonstrated patency of the right sided system.  There was some question whether she had a cardiac event resulting in the DWI signals bilaterally and was worked up for it.  Recently she was in the bathroom at night and her husband heard something I went and saw her and she was unresponsive.  He laid on the floor and she quickly came around.  EMS was called and they came and saw her and as everything checked out well, she was kept at home.  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  I connected with the primary care doctor yesterday and I am seeing her today.  She says that prior to this event happening she was doing very well.  She does note that every time she goes from a seated to standing position, her legs are little bit weak and after a few minutes things equalize.  She is on metoprolol .  Had a lengthy conversation with her husband and her and explained to them the findings and reviewed the images.  The MRA is not of a very good quality but in my review, the carotid artery in the neck and in the head is patent.  There may be some stenosis at the posterior medicating artery junction but it is certainly not completely occluded.  I shared with her that I would like to get a diagnostic angiogram to see the blood flow and explained to her what this entails.  I would like for her to start Plavix  and Given her prescription.  I would like for her to continue her aspirin as well as a dual antiplatelet therapy.  I have also explained to her what a vasovagal event is because I believe that that is what might have happened worsened by the fact that she has decreased  blood flow in her head.  I told her to take measures to prevent blood pressure drops from happening as a part of her beta-blocker treatment.  I will get her scheduled for the diagnostic angiogram and see her back thereafter.

## 2024-02-07 NOTE — Addendum Note (Signed)
 Addended by: Julieanne Hadsall on: 02/07/2024 08:31 AM   Modules accepted: Orders

## 2024-02-07 NOTE — Addendum Note (Signed)
 Addended by: Labib Cwynar on: 02/07/2024 08:58 AM   Modules accepted: Orders

## 2024-02-10 ENCOUNTER — Telehealth: Payer: Self-pay

## 2024-02-10 ENCOUNTER — Telehealth (HOSPITAL_COMMUNITY): Payer: Self-pay | Admitting: Radiology

## 2024-02-10 ENCOUNTER — Other Ambulatory Visit (HOSPITAL_COMMUNITY): Payer: Self-pay | Admitting: Neurosurgery

## 2024-02-10 DIAGNOSIS — I63232 Cerebral infarction due to unspecified occlusion or stenosis of left carotid arteries: Secondary | ICD-10-CM

## 2024-02-10 NOTE — Telephone Encounter (Signed)
 Patient called stating there was a mix up with her medication and that she did not get her medication until Saturday.   Per report from front desk, her medications were sent to the wrong location.  I called the patient to confirm her preferred pharmacy to prevent any future mix up.   She said that she called the pharmacy on Friday and they were in process of filling it but when she turned up at the pharmacy they said that no script had been sent.   She states she spoke to a pharmacist at [facility] on Saturday who filled the script for her and there was no chart.  She was able to take it on Saturday. I apologized to the patient for this ordeal and I   I confirmed that her Pharmacy is listed correctly in her chart.  we sent the medication the correct pharmacy despite patient concerns otherwise.   Patient also asked about the scheduling of her upcoming IR procedure with Dr. Janjua. I told her I would follow up with Rosaline, Dr. Catarino scheduler today.

## 2024-02-10 NOTE — Telephone Encounter (Signed)
 Called pt to schedule cerebral angio with Dr. Rosslyn. Left VM for her to call me back. JM

## 2024-02-11 ENCOUNTER — Telehealth: Payer: Self-pay

## 2024-02-11 NOTE — Telephone Encounter (Signed)
 Patient called to ask about her Plavix .   Patient is scheduled for colonoscopy on December 2nd.   Her provider stated that she would needs to be off Plavix  for 5 days.  She was just started on this medication and wanted to know if she was ok to stop it for her procedure.   Please advise

## 2024-02-11 NOTE — Telephone Encounter (Signed)
 Patient notified that it is ok to be off the Plavix  per Dr. Janjua if the colonoscopy is important for her health. If she decides to go through with the procedure then she should restart this medication as instructed by servicing provider.

## 2024-02-19 ENCOUNTER — Other Ambulatory Visit: Payer: Self-pay

## 2024-02-19 NOTE — Addendum Note (Signed)
 Addended by: Eurydice Calixto on: 02/19/2024 12:28 PM   Modules accepted: Orders

## 2024-02-26 ENCOUNTER — Other Ambulatory Visit (HOSPITAL_COMMUNITY): Payer: Self-pay | Admitting: Neurosurgery

## 2024-02-26 ENCOUNTER — Ambulatory Visit (HOSPITAL_COMMUNITY)
Admission: RE | Admit: 2024-02-26 | Discharge: 2024-02-26 | Disposition: A | Discharge status: Home / Self Care | Source: Ambulatory Visit | Attending: Neurosurgery | Admitting: Neurosurgery

## 2024-02-26 ENCOUNTER — Other Ambulatory Visit: Payer: Self-pay

## 2024-02-26 DIAGNOSIS — I63232 Cerebral infarction due to unspecified occlusion or stenosis of left carotid arteries: Secondary | ICD-10-CM

## 2024-02-26 DIAGNOSIS — I6523 Occlusion and stenosis of bilateral carotid arteries: Secondary | ICD-10-CM | POA: Insufficient documentation

## 2024-02-26 DIAGNOSIS — Z635 Disruption of family by separation and divorce: Secondary | ICD-10-CM | POA: Insufficient documentation

## 2024-02-26 HISTORY — PX: IR ANGIO INTRA EXTRACRAN SEL COM CAROTID INNOMINATE BILAT MOD SED: IMG5360

## 2024-02-26 HISTORY — PX: IR ANGIO VERTEBRAL SEL VERTEBRAL BILAT MOD SED: IMG5369

## 2024-02-26 HISTORY — PX: IR US GUIDE VASC ACCESS RIGHT: IMG2390

## 2024-02-26 MED ORDER — LIDOCAINE HCL 1 % IJ SOLN
20.0000 mL | Freq: Once | INTRAMUSCULAR | Status: AC
  Administered 2024-02-26: 10 mL via INTRADERMAL

## 2024-02-26 MED ORDER — FENTANYL CITRATE (PF) 100 MCG/2ML IJ SOLN
INTRAMUSCULAR | Status: AC
  Filled 2024-02-26: qty 2

## 2024-02-26 MED ORDER — VERAPAMIL HCL 2.5 MG/ML IV SOLN
INTRAVENOUS | Status: AC
  Filled 2024-02-26: qty 2

## 2024-02-26 MED ORDER — HEPARIN SODIUM (PORCINE) 1000 UNIT/ML IJ SOLN
INTRAMUSCULAR | Status: AC | PRN
  Administered 2024-02-26: 4000 [IU] via INTRAVENOUS

## 2024-02-26 MED ORDER — MIDAZOLAM HCL (PF) 2 MG/2ML IJ SOLN
INTRAMUSCULAR | Status: AC | PRN
  Administered 2024-02-26: 1 mg via INTRAVENOUS

## 2024-02-26 MED ORDER — LIDOCAINE HCL 1 % IJ SOLN
INTRAMUSCULAR | Status: AC
  Filled 2024-02-26: qty 20

## 2024-02-26 MED ORDER — MIDAZOLAM HCL 2 MG/2ML IJ SOLN
INTRAMUSCULAR | Status: AC
  Filled 2024-02-26: qty 2

## 2024-02-26 MED ORDER — FENTANYL CITRATE (PF) 100 MCG/2ML IJ SOLN
INTRAMUSCULAR | Status: AC | PRN
  Administered 2024-02-26: 50 ug via INTRAVENOUS

## 2024-02-26 MED ORDER — HEPARIN SODIUM (PORCINE) 1000 UNIT/ML IJ SOLN
INTRAMUSCULAR | Status: AC
  Filled 2024-02-26: qty 10

## 2024-02-26 MED ORDER — NITROGLYCERIN 1 MG/10 ML FOR IR/CATH LAB
INTRA_ARTERIAL | Status: AC
  Filled 2024-02-26: qty 10

## 2024-02-26 MED ORDER — IOHEXOL 300 MG/ML  SOLN
150.0000 mL | Freq: Once | INTRAMUSCULAR | Status: AC | PRN
  Administered 2024-02-26: 35 mL via INTRA_ARTERIAL

## 2024-02-26 NOTE — Discharge Instructions (Signed)
 Radial Site Care  This sheet gives you information about how to care for yourself after your procedure. Your health care provider may also give you more specific instructions. If you have problems or questions, contact your health care provider. What can I expect after the procedure? After the procedure, it is common to have: Bruising and tenderness at the catheter insertion area. Follow these instructions at home: Medicines Take over-the-counter and prescription medicines only as told by your health care provider. Insertion site care Follow instructions from your health care provider about how to take care of your insertion site. Make sure you: Wash your hands with soap and water before you remove your bandage (dressing). If soap and water are not available, use hand sanitizer. May remove dressing in 24 hours. Check your insertion site every day for signs of infection. Check for: Redness, swelling, or pain. Fluid or blood. Pus or a bad smell. Warmth. Do no take baths, swim, or use a hot tub for 5 days. You may shower 24-48 hours after the procedure. Remove the dressing and gently wash the site with plain soap and water. Pat the area dry with a clean towel. Do not rub the site. That could cause bleeding. Do not apply powder or lotion to the site. Activity  For 24 hours after the procedure, or as directed by your health care provider: Do not flex or bend the affected arm. Do not push or pull heavy objects with the affected arm. Do not drive yourself home from the hospital or clinic. You may drive 24 hours after the procedure. Do not operate machinery or power tools. KEEP ARM ELEVATED THE REMAINDER OF THE DAY. Do not push, pull or lift anything that is heavier than 10 lb for 5 days. Ask your health care provider when it is okay to: Return to work or school. Resume usual physical activities or sports. Resume sexual activity. General instructions If the catheter site starts to  bleed, raise your arm and put firm pressure on the site. If the bleeding does not stop, get help right away. This is a medical emergency. DRINK PLENTY OF FLUIDS FOR THE NEXT 2-3 DAYS. No alcohol consumption for 24 hours after receiving sedation. If you went home on the same day as your procedure, a responsible adult should be with you for the first 24 hours after you arrive home. Keep all follow-up visits as told by your health care provider. This is important. Contact a health care provider if: You have a fever. You have redness, swelling, or yellow drainage around your insertion site. Get help right away if: You have unusual pain at the radial site. The catheter insertion area swells very fast. The insertion area is bleeding, and the bleeding does not stop when you hold steady pressure on the area. Your arm or hand becomes pale, cool, tingly, or numb. These symptoms may represent a serious problem that is an emergency. Do not wait to see if the symptoms will go away. Get medical help right away. Call your local emergency services (911 in the [place] Do not drive yourself to the hospital. Summary After the procedure, it is common to have bruising and tenderness at the site. Follow instructions from your health care provider about how to take care of your radial site wound. Check the wound every day for signs of infection.  This information is not intended to replace advice given to you by your health care provider. Make sure you discuss any questions you have with  your health care provider. Document Revised: 04/10/2017 Document Reviewed: 04/10/2017 Elsevier Patient Education  2020 [employer].

## 2024-02-26 NOTE — H&P (Signed)
 64yo lady with carotid occlusion  Past Medical History:  Diagnosis Date   Chest pain 09/01/2008   Atypical chect pains---NUC performed, which showed a post stress EF of 93%, Normal LV size, EKG negative for ischemia. An ECHO was also performed on this date, which showed an EF=>55% and only mild tricuspid and mitral regurgitation---normal for age   Dyslipidemia 09/09/2008   Patient had been taking Simvastatin , but was recently switched to Atorvastatin  80 after an NMR LipoProfile with lipids of 1602, LDL-C of 129, and LDL size of 20.4 (taken on 01/20/2012).   Hypertension 09/09/2008   Past Surgical History:  Procedure Laterality Date   CARDIOVASCULAR STRESS TEST  09/01/2008   Post stress EF of 93%, Normal LV size, EKG negative for ischemia.   US  ECHOCARDIOGRAPHY  09/01/2008   EF=>55% and only mild tricuspid and mitral regurgitation---normal for age   Social History   Socioeconomic History   Marital status: Legally Separated    Spouse name: Not on file   Number of children: Not on file   Years of education: Not on file   Highest education level: Not on file  Occupational History   Not on file  Tobacco Use   Smoking status: Never   Smokeless tobacco: Not on file  Substance and Sexual Activity   Alcohol use: Yes   Drug use: Not on file   Sexual activity: Not on file  Other Topics Concern   Not on file  Social History Narrative   Not on file   Social Drivers of Health   Financial Resource Strain: Low Risk (05/31/2023)   Received from [facility]   Overall Financial Resource Strain (CARDIA)    Difficulty of Paying Living Expenses: Not hard at all  Food Insecurity: No Food Insecurity (05/31/2023)   Received from [facility]   Hunger Vital Sign    Within the past 12 months, you worried that your food would run out before you got the money to buy more.: Never true    Within the past 12 months, the food you bought just didn't last and you didn't have money to get more.: Never true   Transportation Needs: No Transportation Needs (05/31/2023)   Received from [facility] - Transportation    Lack of Transportation (Medical): No    Lack of Transportation (Non-Medical): No  Physical Activity: Insufficiently Active (05/31/2023)   Received from [facility]   Exercise Vital Sign    On average, how many days per week do you engage in moderate to strenuous exercise (like a brisk walk)?: 2 days    On average, how many minutes do you engage in exercise at this level?: 20 min  Stress: No Stress Concern Present (05/31/2023)   Received from [facility] - Occupational Stress Questionnaire    Feeling of Stress : Not at all  Social Connections: Socially Integrated (05/31/2023)   Received from [facility]   Social Network    How would you rate your social network (family, work, friends)?: Good participation with social networks  Intimate Partner Violence: Not At Risk (05/23/2023)   Received from [facility]   HITS    Over the last 12 months how often did your partner physically hurt you?: Never    Over the last 12 months how often did your partner insult you or talk down to you?: Never    Over the last 12 months how often did your partner threaten you with  physical harm?: Never    Over the last 12 months how often did your partner scream or curse at you?: Never   Family History  Problem Relation Age of Onset   Cancer Mother    Diabetes Mother    Diabetes Father    Heart disease Father    Heart attack Father    Cancer Maternal Grandmother    Heart failure Maternal Grandfather    Diabetes Paternal Grandfather    Heart disease Paternal Grandfather    Allergies  Allergen Reactions   Black Cohosh Shortness Of Breath   Other Palpitations    Palpitations   Palpitations   Sulfamethoxazole-Trimethoprim Diarrhea and Palpitations   Amoxicillin Hives   Atorvastatin  Other (See Comments)    Myalgias/cramps   Bee Venom  Swelling   Chapstick Swelling   Codeine    Moxifloxacin Hcl In Nacl     Palpitations    Scheduled Meds: Continuous Infusions: PRN Meds:. Vitals:   02/26/24 0830  BP: (!) 152/70  Pulse: 74  Resp: 20  Temp: 98.1 F (36.7 C)  SpO2: 98%   Physical Exam HENT:     Head: Normocephalic.     Nose: Nose normal.  Eyes:     Pupils: Pupils are equal, round, and reactive to light.  Cardiovascular:     Rate and Rhythm: Normal rate.  Pulmonary:     Effort: Pulmonary effort is normal.  Abdominal:     General: Abdomen is flat.  Musculoskeletal:     Cervical back: Normal range of motion.  Neurological:     Mental Status: She is alert.   ASSESSMENT: BILATERAL CAROTID OCCLUSION  PLAN: DIAGNOSTIC CEREBRAL ANGIOGRAM  M: 1 ASA:1

## 2024-03-06 ENCOUNTER — Ambulatory Visit: Admitting: Neurosurgery

## 2024-03-06 ENCOUNTER — Encounter: Payer: Self-pay | Admitting: Neurosurgery

## 2024-03-06 VITALS — BP 132/77 | HR 98 | Temp 97.9°F | Ht 63.0 in | Wt 178.2 lb

## 2024-03-06 DIAGNOSIS — I6522 Occlusion and stenosis of left carotid artery: Secondary | ICD-10-CM

## 2024-03-06 DIAGNOSIS — I63232 Cerebral infarction due to unspecified occlusion or stenosis of left carotid arteries: Secondary | ICD-10-CM

## 2024-03-06 NOTE — Progress Notes (Signed)
 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  Her left carotid artery had already occluded and she was getting collaterals from her ophthalmic.  Unfortunately over time she developed orthostasis at night and was worked up and found to have critical stenosis in the contralateral, right sided, carotid artery which previously was completely open.  She recently underwent a diagnostic angiogram which demonstrated that she has beading of that vessel and a critical stenosis at the carotid bifurcation.  I reviewed the images with her and her husband and I told them that I do not believe that doing balloon angioplasty and stenting would be in her benefit period.  Clearly right now she is getting blood through collaterals and maintaining a good quality of life.  The probably doing an angioplasty and stenting is that you may actually accelerate the occlusion over time and not allow the brain to develop enough collaterals.  We talked about avoiding fluids at night so it obviates the need for her to get up out of bed at night, maintaining a healthy blood pressure around the 140 range [she is 137/77 today].  I also went over the medication with her and I told her that I think it would be prudent for her to talk to her primary care doctor.  Today I also called Dr. Joesph and I spoke to him and he has kindly agreed to see her and talk about her medication regimen to keep her at the best blood pressure.  I will give the patient a call in 6 weeks and I urged her to continue her aspirin and Plavix .

## 2024-03-24 ENCOUNTER — Ambulatory Visit: Admitting: Neurosurgery

## 2024-04-09 ENCOUNTER — Encounter (HOSPITAL_COMMUNITY): Payer: Self-pay

## 2024-04-09 NOTE — Addendum Note (Signed)
 Encounter addended by: Delano Frate M, MD on: 04/09/2024 1:18 PM  Actions taken: Clinical Note Signed

## 2024-04-09 NOTE — Op Note (Signed)
 IR Angiogram Cerebral  Date of Surgery: 02/26/2024  Surgeon: Dino CHRISTELLA Sable, MD   Narrative & Impression TECHNIQUE: Diagnostic cerebral angiogram   PREOPERATIVE DIAGNOSIS: Bilateral carotid stenosis POSTOPERATIVE DIAGNOSIS: Same   PROCEDURE PERFORMED:  1: Ultrasound-guided right  Radial artery access 2: Multivessel diagnostic angiography 4: Closure of the right radial artery access with closure device: TR Band   DEVICES USED:  1: 5 French sheath 2: 5 French Simmons 2 catheter 3: 0.038 inch Glidewire 4: Closure with: TR Band   VESSELS CATHETERIZED: 1: Right radial artery 2: Aortic Arch 3: Right common carotid artery 4: Right vertebral artery 5: Left common carotid artery 6: Left vertebral artery 7: Right brachial artery 8: Right subclavian artery 9: Left subclavian artery   ANESTHESIA: Local anesthesia and conscious sedation. 25 mcg of fentanyl  and 1 mg of Versed  from 09:18 to 09:28 Under my direct supervision, medications as listed in the procedure log were administered intravenously by the sedation trained personnel for moderate sedation. Pulse oximetry, heart rate, and blood pressure were continuously monitored by an independent trained observer present.   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  I had seen the patient previously for the tumor and she was treated with the radiosurgery though repeat imaging suggested new, contralateral carotid stenosis. patient's films were reviewed with the patient and the family and options were discussed with them including but not limited to noninvasive imaging, observation, angiography amongst others. Benefits and risks of each option were discussed with them in layman's terms with the risks of the angiogram including but not limited to infection,  hemorrhage, stroke, paralysis, blindness, speech impairment, renal failure, DVT, PE, cardio pulmonary complications and death amongst others. All the patient's questions were answered to their satisfaction as voiced by them and they requested for us  to proceed.   DESCRIPTION OF PROCEDURE: Patient was brought to the angio suite and after patient was positioned and all pressure points padded, patient was prepped and draped in usual sterile fashion. After injection of local anesthetic, ultrasound guidance was used to evaluate the right wrist site; patency of the right radial artery was noted and documented in PACS. Using a standard micropuncture kit with ultrasound guidance realtime visualization, the micropuncture needle was advanced into the right radial artery and intravascular location of the needle tip was confirmed on ultrasound. Thereafter using a modified Seldinger technique a 5 French sheath was inserted. The diagnostic catheter was navigated over the aortic arch and vessels were selectively catheterized over a 0.038 inch Glidewire. After the images were obtained they were reviewed and found to be of diagnostic quality. The diagnostic catheter and wire were then removed and the sheath discontinued with closure of the access site with TR Band At the end of the procedure patient's pulses were present.   I was present for the entire procedure.    Right common carotid artery cervical view, AP and lateral views demonstrates the common carotid artery which courses superiorly and bifurcates into the external the internal carotid arteries there is minimal disease burden at the origin of the right internal carotid artery as it courses superiorly into the skull base.   Right common carotid artery cranial view, AP and lateral view demonstrates the internal carotid artery coursing into the skull base through the petrous, lacerum and cavernous segments and then in the  post clinoid segment after the off take of the  ophthalmic artery, there is severe stenosis of the vessel with critical narrowing just proximal to the ICA bifurcation.  The middle cerebral and the intracerebral arteries are filling no diminutively and supplying their individual territories.  There does not seem to be any collateral supply from the external carotid arteries   Left common carotid artery cervical view, AP and lateral views demonstrates the common carotid artery which courses superiorly and bifurcates into the external the internal carotid arteries there is minimal disease burden at the origin of the right internal carotid artery as it courses superiorly into the skull base.   Left common carotid artery injection cranial AP and lateral views demonstrates internal carotid artery coursing through the skull base through the petrous, lacerum segments with supply to the internal carotid artery circulation through branches from the internal maxillary artery as well as the ophthalmic artery in a retrograde fashion   Left subclavian artery injection demonstrates the origin of the left vertebral artery without any significant stenosis. The artery then continues superiorly in a tortuous fashion.   Left vertebral artery injection cranial AP and lateral view demonstrates the left V2, V3 and V4 segments as it continues into the basilar artery which continues superiorly into the posterior cerebral circulation.  There is supply to the left sided convexity vasculature through collaterals   Right vertebral artery injection cranial AP and lateral view demonstrates the right V2, V3 and V4 segments as it continues into the basilar artery which continues superiorly into the posterior cerebral circulation.      IMPRESSION: Occluded left internal carotid artery circulation from the cavernous sinus tumor supplied to the internal carotid artery from branches from the internal maxillary artery as well as from the ophthalmic artery. Right internal carotid artery  and the intracranial circulation after the origin of the ophthalmic artery with significant stenosis and critical stenosis proximal to the bifurcation with antegrade flow into the middle and anterior cerebral arteries.  No supply from the posterior circulation or from the external circulation.   PLAN: Patient transferred to the recovery room

## 2024-04-09 NOTE — Addendum Note (Signed)
 Encounter addended by: Isabelle Matt H on: 04/09/2024 12:54 PM  Actions taken: Imaging Exam ended

## 2024-04-17 ENCOUNTER — Ambulatory Visit: Admitting: Neurosurgery

## 2024-04-17 DIAGNOSIS — R55 Syncope and collapse: Secondary | ICD-10-CM | POA: Diagnosis not present

## 2024-04-17 DIAGNOSIS — D320 Benign neoplasm of cerebral meninges: Secondary | ICD-10-CM | POA: Diagnosis not present

## 2024-04-17 DIAGNOSIS — I6523 Occlusion and stenosis of bilateral carotid arteries: Secondary | ICD-10-CM

## 2024-04-17 DIAGNOSIS — I63232 Cerebral infarction due to unspecified occlusion or stenosis of left carotid arteries: Secondary | ICD-10-CM

## 2024-04-17 NOTE — Progress Notes (Signed)
 Virtual Visit via Telephone Note  I connected with Monica Wells on 04/17/24 at  7:40 AM EST by telephone and verified that I am speaking with the correct person using two identifiers.  Location: Patient: Home Provider: Clinic   I discussed the limitations, risks, security and privacy concerns of performing an evaluation and management service by telephone and the availability of in person appointments. I also discussed with the patient that there may be a patient responsible charge related to this service. The patient expressed understanding and agreed to proceed.  64 year old lady with a history of a left sided cavernous sinus meningioma which eventually ended up occluding the carotid artery and patient had stereotactic radiosurgery.  Patient eventually developed right sided stenosis as well and now has a very tight stenosis in the carotid.  Because she was having syncopal episodes I recommended that she talks to her primary care doctor/cardiologist and she spoke to her primary care team and they reached out to her cardiologist but for a few weeks not have not gotten back to her.  He decided that she is indeed going to continue with half the dose of metoprolol  but I agree not to quit it altogether and she is going to call her cardiologist, Dr. Sherron Champ today and if he has not gotten back to her by the end of the day, then she will let me know and then I will try to carve him a call on Monday   I discussed the assessment and treatment plan with the patient. The patient was provided an opportunity to ask questions and all were answered. The patient agreed with the plan and demonstrated an understanding of the instructions.   The patient was advised to call back or seek an in-person evaluation if the symptoms worsen or if the condition fails to improve as anticipated.  I provided 10 minutes of non-face-to-face time during this encounter.   Lisa-Marie Rueger, MD
